# Patient Record
Sex: Male | Born: 1975 | Race: White | Hispanic: No | Marital: Single | Smoking: Never smoker
Health system: Southern US, Community
[De-identification: ages and names within clinical notes are randomized; demographics above are authoritative.]

---

## 2002-11-14 ENCOUNTER — Ambulatory Visit (HOSPITAL_BASED_OUTPATIENT_CLINIC_OR_DEPARTMENT_OTHER): Admission: RE | Admit: 2002-11-14 | Discharge: 2002-11-14 | Payer: Self-pay | Admitting: Orthopedic Surgery

## 2007-11-09 ENCOUNTER — Encounter: Admission: RE | Admit: 2007-11-09 | Discharge: 2007-11-09 | Payer: Self-pay | Admitting: Neurology

## 2012-04-01 ENCOUNTER — Other Ambulatory Visit: Payer: Self-pay | Admitting: Occupational Medicine

## 2012-04-01 ENCOUNTER — Ambulatory Visit (HOSPITAL_BASED_OUTPATIENT_CLINIC_OR_DEPARTMENT_OTHER)
Admission: RE | Admit: 2012-04-01 | Discharge: 2012-04-01 | Disposition: A | Discharge status: Home / Self Care | Payer: Self-pay | Source: Ambulatory Visit | Attending: Occupational Medicine | Admitting: Occupational Medicine

## 2012-04-01 DIAGNOSIS — Z0000 Encounter for general adult medical examination without abnormal findings: Secondary | ICD-10-CM | POA: Insufficient documentation

## 2013-04-14 IMAGING — CR DG CHEST 1V
1 series · 1 of 1 positions shown · non-contrast
Comparison: None.

CLINICAL DATA: Annual physical exam

CHEST - 1 VIEW

[w chest pa]
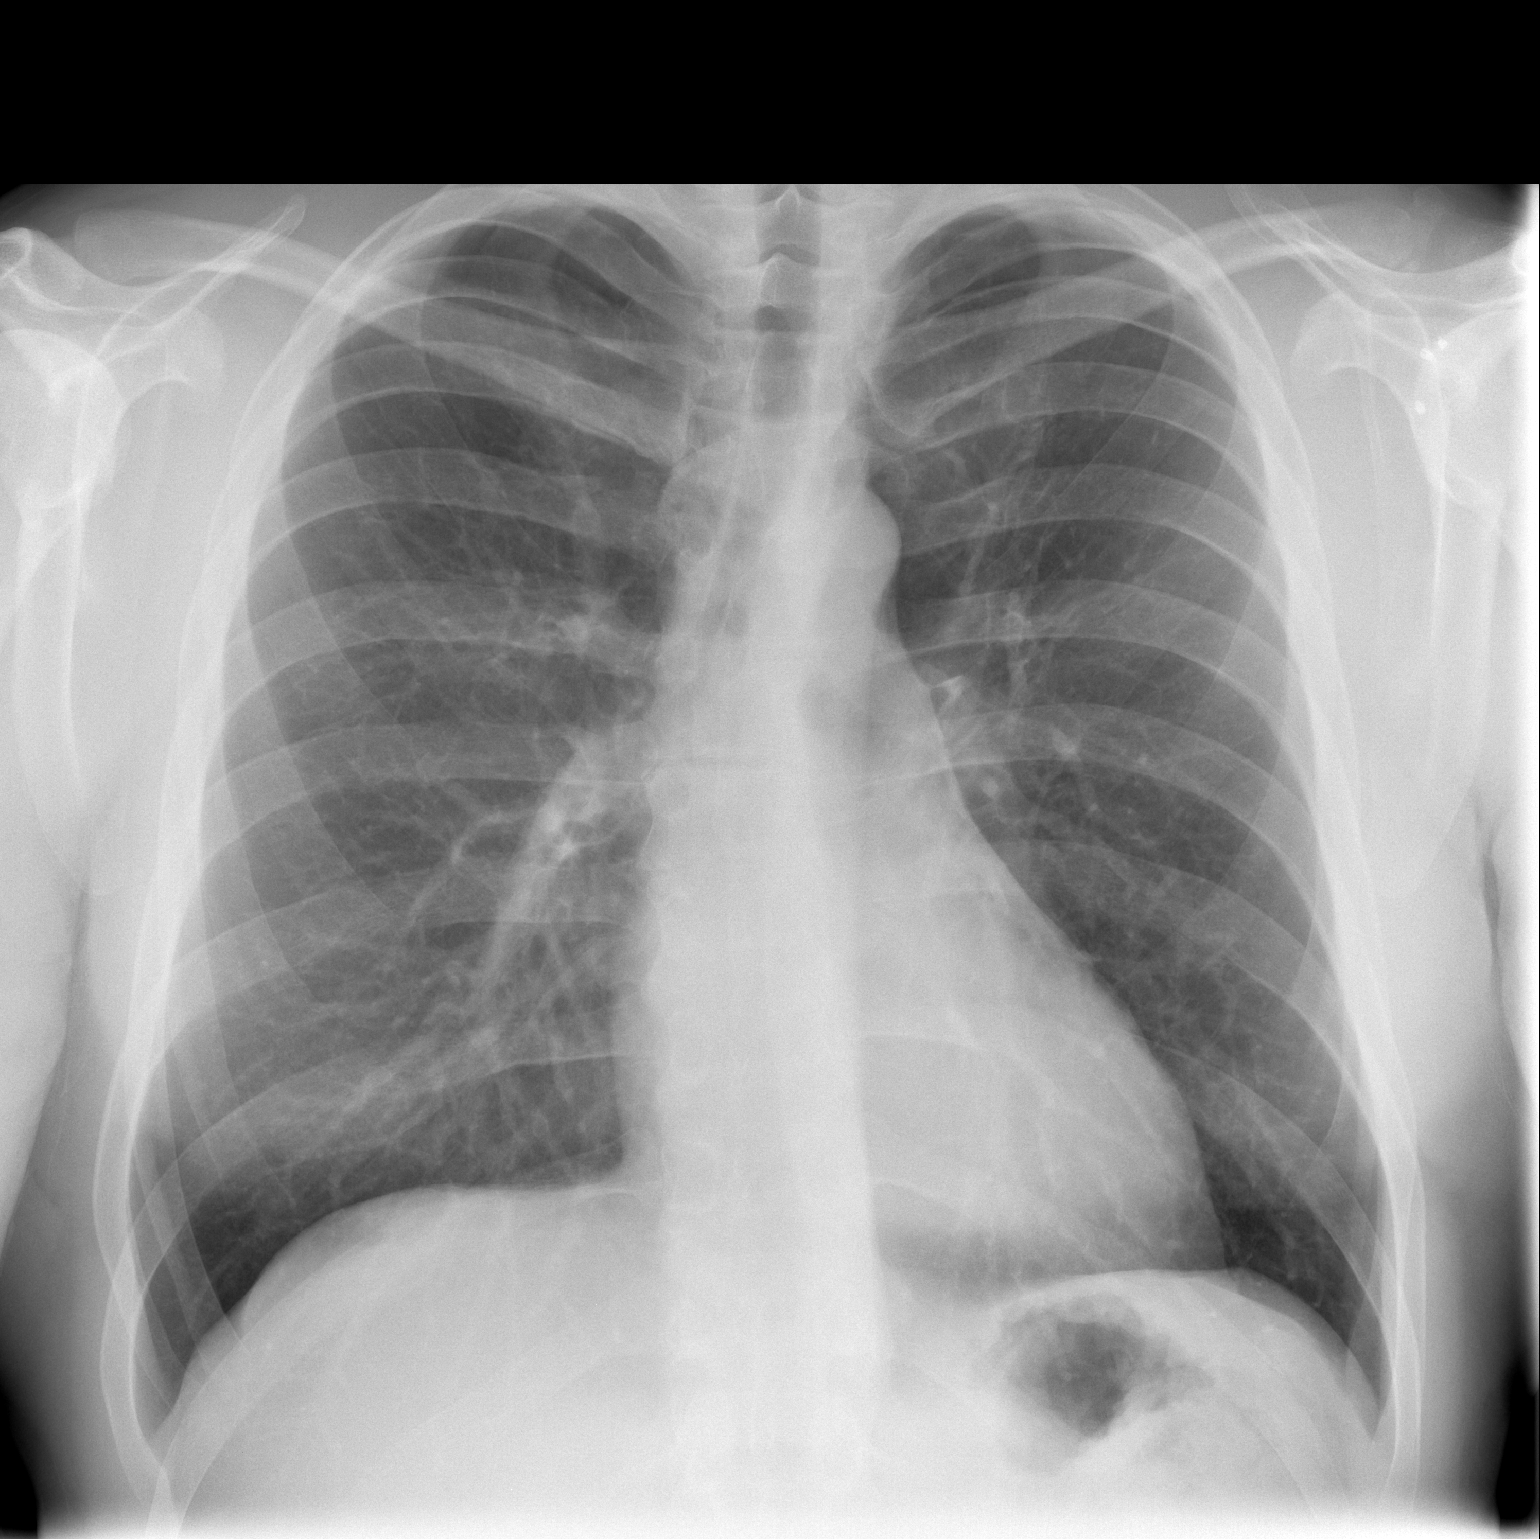

[1 of 1 positions shown; findings below may reference images not displayed]

FINDINGS: Cardiomediastinal silhouette is unremarkable.  No acute
infiltrate or pleural effusion.  No pulmonary edema.  Mild mid
thoracic dextroscoliosis.
IMPRESSION: No active disease.  Mild thoracic dextroscoliosis.

## 2017-07-30 ENCOUNTER — Ambulatory Visit (INDEPENDENT_AMBULATORY_CARE_PROVIDER_SITE_OTHER): Payer: Managed Care, Other (non HMO)

## 2017-07-30 ENCOUNTER — Ambulatory Visit (INDEPENDENT_AMBULATORY_CARE_PROVIDER_SITE_OTHER): Payer: Managed Care, Other (non HMO) | Admitting: Podiatry

## 2017-07-30 ENCOUNTER — Other Ambulatory Visit: Payer: Self-pay | Admitting: Podiatry

## 2017-07-30 ENCOUNTER — Encounter: Payer: Self-pay | Admitting: Podiatry

## 2017-07-30 DIAGNOSIS — S99921A Unspecified injury of right foot, initial encounter: Secondary | ICD-10-CM

## 2017-07-30 DIAGNOSIS — D169 Benign neoplasm of bone and articular cartilage, unspecified: Secondary | ICD-10-CM

## 2017-07-30 DIAGNOSIS — L600 Ingrowing nail: Secondary | ICD-10-CM | POA: Diagnosis not present

## 2017-07-30 MED ORDER — CEPHALEXIN 500 MG PO CAPS: 500.0000 mg | ORAL_CAPSULE | Freq: Three times a day (TID) | ORAL | 1 refills | Status: AC

## 2017-07-30 NOTE — Progress Notes (Signed)
Subjective:   Patient ID: Scott Page, male   DOB: 42 y.o.   MRN: [id number]   HPI Patient presents stating that he traumatized his right big toenail 7 months ago giving him off and on problems.  He did have a portion of the nail removed but that did not help him and at times it bleeds and gets irritated.  He feels like at times there is some   Review of Systems  All other systems reviewed and are negative.       Objective:  Physical Exam  Constitutional: He appears well-developed and well-nourished.  Cardiovascular: Intact distal pulses.  Pulmonary/Chest: Effort normal.  Musculoskeletal: Normal range of motion.  Neurological: He is alert.  Skin: Skin is warm.  Nursing note and vitals reviewed.   Neurovascular status intact muscle strength adequate range of motion within normal limits with patient noted to have irritation of the right hallux nail lateral border with incurvation and discoloration.  There is nail over the area with slight irritated type tissue and it is localized with no proximal edema erythema or drainage noted.       Assessment:  Traumatized right hallux nail with probable ingrown toenail and possibly bone spur involvement     Plan:  H&P condition reviewed with patient.  I also took x-rays and reviewed with patient.  At this point I recommended removal of the nail corner with exploration of the area and possible ingrown toenail procedure or bony excision of that corner if it turns out there is bone formation that is present.  I explained procedure and risk and patient after understanding this wants procedure understanding risk.  Sterile prep was done to the toe and utilizing sterile instrumentation and sterile clubbing I went ahead and remove the lateral border and I did note there was some bone irritation enlargement in the area.  I went ahead and utilizing a bone cutting forceps I removed a small amount of bone from the side flush the area and did not note any  further pathology.  I then went ahead and applied sterile dressing that the patient will leave on for the next week and as precautionary measure placed on cephalexin 500 mg 3 times daily.  I gave instructions on open toed shoes and dispensed surgical shoes and gave strict instructions if any issues were to occur to contact us immediately and patient will be reevaluated again in 1 week  X-rays indicate there is a small amount of what appears to be bony irritation of the lateral side of the distal phalanx right hallux

## 2017-08-02 ENCOUNTER — Ambulatory Visit (INDEPENDENT_AMBULATORY_CARE_PROVIDER_SITE_OTHER): Payer: Managed Care, Other (non HMO) | Admitting: Podiatry

## 2017-08-02 ENCOUNTER — Encounter: Payer: Self-pay | Admitting: Podiatry

## 2017-08-02 ENCOUNTER — Ambulatory Visit (INDEPENDENT_AMBULATORY_CARE_PROVIDER_SITE_OTHER): Payer: Managed Care, Other (non HMO)

## 2017-08-02 DIAGNOSIS — S99922D Unspecified injury of left foot, subsequent encounter: Secondary | ICD-10-CM

## 2017-08-02 DIAGNOSIS — D169 Benign neoplasm of bone and articular cartilage, unspecified: Secondary | ICD-10-CM

## 2017-08-02 MED ORDER — HYDROCODONE-ACETAMINOPHEN 10-325 MG PO TABS: 1.0000 | ORAL_TABLET | Freq: Three times a day (TID) | ORAL | 0 refills | Status: AC | PRN

## 2017-08-02 NOTE — Patient Instructions (Addendum)
ANTIBACTERIAL SOAP INSTRUCTIONS  THE DAY AFTER PROCEDURE  Please follow the instructions your doctor has marked.   Shower as usual. Before getting out, place a drop of antibacterial liquid soap (Dial) on a wet, clean washcloth.  Gently wipe washcloth over affected area.  Afterward, rinse the area with warm water.  Blot the area dry with a soft cloth and cover with antibiotic ointment (neosporin, polysporin, bacitracin) and band aid or gauze and tape Place 3-4 drops of antibacterial liquid soap in a quart of warm tap water.  Submerge foot into water for 20 minutes.  If bandage was applied after your procedure, leave on to allow for easy lift off, then remove and continue with soak for the remaining time.  Next, blot area dry with a soft cloth and cover with a bandage.  Apply other medications as directed by your doctor, such as cortisporin otic solution (eardrops) or neosporin antibiotic ointment  Long Term Care Instructions-Post Nail Surgery   You have had your ingrown toenail and root treated with a chemical.  This chemical causes a burn that will drain and ooze like a blister.  This can drain for 6-8 weeks or longer.  It is important to keep this area clean, covered, and follow the soaking instructions dispensed at the time of your surgery.  This area will eventually dry and form a scab.  Once the scab forms you no longer need to soak or apply a dressing.  If at any time you experience an increase in pain, redness, swelling, or drainage, you should contact the office as soon as possible.   Soak Instructions    THE DAY AFTER THE PROCEDURE  Place 1/4 cup of epsom salts in a quart of warm tap water.  Submerge your foot or feet with outer bandage intact for the initial soak; this will allow the bandage to become moist and wet for easy lift off.  Once you remove your bandage, continue to soak in the solution for 20 minutes.  This soak should be done twice a day.  Next, remove your foot or feet  from solution, blot dry the affected area and cover.  You may use a band aid large enough to cover the area or use gauze and tape.  Apply other medications to the area as directed by the doctor such as polysporin neosporin.  IF YOUR SKIN BECOMES IRRITATED WHILE USING THESE INSTRUCTIONS, IT IS OKAY TO SWITCH TO  WHITE VINEGAR AND WATER. Or you may use antibacterial soap and water to keep the toe clean  Monitor for any signs/symptoms of infection. Call the office immediately if any occur or go directly to the emergency room. Call with any questions/concerns.    Long Term Care Instructions-Post Nail Surgery  You have had your ingrown toenail and root treated with a chemical.  This chemical causes a burn that will drain and ooze like a blister.  This can drain for 6-8 weeks or longer.  It is important to keep this area clean, covered, and follow the soaking instructions dispensed at the time of your surgery.  This area will eventually dry and form a scab.  Once the scab forms you no longer need to soak or apply a dressing.  If at any time you experience an increase in pain, redness, swelling, or drainage, you should contact the office as soon as possible.  

## 2017-08-02 NOTE — Progress Notes (Signed)
Subjective:   Patient ID: Scott Page, male   DOB: 42 y.o.   MRN: [id number]   HPI Patient states his big toe was bothering him and is been leaving the dressing on and he feels like it was too tight   ROS      Objective:  Physical Exam  Dressing found to be on right hallux and upon removal good Flow was noted to be immediate to the toe and there is a satisfactory covering of the bone structure on the lateral side of the toe which was necessary for the procedure we did last week.  No proximal edema erythema or drainage was noted     Assessment:  Probability that the dressing was aggravating him and with removal he feels much better with still mild discomfort with excellent healing of the site with strong indications that no infective process is present     Plan:  H&P condition reviewed and at this time sterile dressing reapplied to the toe reviewed x-rays and gradual soaks can be initiated and I did prescribe Vicodin for night usage.  Patient will be seen back for [place] to recheck again if symptoms were to change at all or if any proximal issues were to occur or if any issues were to occur and that he is concerned about  X-ray indicate satisfactory resection of bone with good alignment

## 2017-08-06 ENCOUNTER — Ambulatory Visit: Payer: Managed Care, Other (non HMO) | Admitting: Podiatry
